# Patient Record
Sex: Male | Born: 1935 | Race: White | Hispanic: No
Health system: Southern US, Community
[De-identification: ages and names within clinical notes are randomized; demographics above are authoritative.]

---

## 2003-10-15 ENCOUNTER — Other Ambulatory Visit: Payer: Self-pay

## 2005-03-11 ENCOUNTER — Inpatient Hospital Stay: Payer: Self-pay | Admitting: Internal Medicine

## 2005-03-11 ENCOUNTER — Other Ambulatory Visit: Payer: Self-pay

## 2011-07-20 ENCOUNTER — Ambulatory Visit: Payer: Self-pay | Admitting: *Deleted

## 2012-09-06 ENCOUNTER — Ambulatory Visit: Payer: Self-pay | Admitting: *Deleted

## 2012-09-12 ENCOUNTER — Ambulatory Visit: Payer: Self-pay | Admitting: Internal Medicine

## 2012-09-18 ENCOUNTER — Inpatient Hospital Stay: Payer: Self-pay | Admitting: Internal Medicine

## 2012-09-18 LAB — COMPREHENSIVE METABOLIC PANEL
Alkaline Phosphatase: 193 U/L — ABNORMAL HIGH (ref 50–136)
Anion Gap: 14 (ref 7–16)
Creatinine: 2.63 mg/dL — ABNORMAL HIGH (ref 0.60–1.30)
EGFR (African American): 24 — ABNORMAL LOW
EGFR (Non-African Amer.): 21 — ABNORMAL LOW
Osmolality: 285 (ref 275–301)
SGOT(AST): 33 U/L (ref 15–37)
Sodium: 138 mmol/L (ref 136–145)

## 2012-09-18 LAB — CBC WITH DIFFERENTIAL/PLATELET
Basophil #: 0.2 10*3/uL — ABNORMAL HIGH (ref 0.0–0.1)
Basophil %: 1.4 %
Eosinophil #: 0.1 10*3/uL (ref 0.0–0.7)
HCT: 31.7 % — ABNORMAL LOW (ref 40.0–52.0)
HGB: 9.8 g/dL — ABNORMAL LOW (ref 13.0–18.0)
Lymphocyte #: 3.2 10*3/uL (ref 1.0–3.6)
Lymphocyte %: 28.4 %
MCH: 27.6 pg (ref 26.0–34.0)
MCV: 89 fL (ref 80–100)
Monocyte #: 0.4 x10 3/mm (ref 0.2–1.0)
Neutrophil #: 7.5 10*3/uL — ABNORMAL HIGH (ref 1.4–6.5)
Neutrophil %: 65.8 %
RBC: 3.56 10*6/uL — ABNORMAL LOW (ref 4.40–5.90)
RDW: 17.1 % — ABNORMAL HIGH (ref 11.5–14.5)
WBC: 11.4 10*3/uL — ABNORMAL HIGH (ref 3.8–10.6)

## 2012-09-19 DIAGNOSIS — J9600 Acute respiratory failure, unspecified whether with hypoxia or hypercapnia: Secondary | ICD-10-CM

## 2012-09-19 DIAGNOSIS — I5031 Acute diastolic (congestive) heart failure: Secondary | ICD-10-CM

## 2012-09-19 DIAGNOSIS — I369 Nonrheumatic tricuspid valve disorder, unspecified: Secondary | ICD-10-CM

## 2012-09-19 LAB — BASIC METABOLIC PANEL
BUN: 44 mg/dL — ABNORMAL HIGH (ref 7–18)
Calcium, Total: 9 mg/dL (ref 8.5–10.1)
Chloride: 101 mmol/L (ref 98–107)
Co2: 22 mmol/L (ref 21–32)
Creatinine: 2.41 mg/dL — ABNORMAL HIGH (ref 0.60–1.30)
EGFR (African American): 27 — ABNORMAL LOW
EGFR (Non-African Amer.): 23 — ABNORMAL LOW
Osmolality: 289 (ref 275–301)

## 2012-09-19 LAB — CBC WITH DIFFERENTIAL/PLATELET
Eosinophil #: 0 10*3/uL (ref 0.0–0.7)
HCT: 35.7 % — ABNORMAL LOW (ref 40.0–52.0)
HGB: 11.9 g/dL — ABNORMAL LOW (ref 13.0–18.0)
Lymphocyte %: 24.3 %
Monocyte #: 0 x10 3/mm — ABNORMAL LOW (ref 0.2–1.0)
Neutrophil %: 73.8 %
RDW: 16.9 % — ABNORMAL HIGH (ref 11.5–14.5)
WBC: 7.2 10*3/uL (ref 3.8–10.6)

## 2012-09-19 LAB — CK TOTAL AND CKMB (NOT AT ARMC)
CK, Total: 84 U/L (ref 35–232)
CK, Total: 95 U/L (ref 35–232)
CK, Total: 96 U/L (ref 35–232)
CK-MB: 4.1 ng/mL — ABNORMAL HIGH (ref 0.5–3.6)
CK-MB: 5.7 ng/mL — ABNORMAL HIGH (ref 0.5–3.6)
CK-MB: 6.9 ng/mL — ABNORMAL HIGH (ref 0.5–3.6)

## 2012-09-20 DIAGNOSIS — I5031 Acute diastolic (congestive) heart failure: Secondary | ICD-10-CM

## 2012-09-20 LAB — BASIC METABOLIC PANEL
Anion Gap: 11 (ref 7–16)
Calcium, Total: 8.8 mg/dL (ref 8.5–10.1)
Co2: 24 mmol/L (ref 21–32)
Creatinine: 2.81 mg/dL — ABNORMAL HIGH (ref 0.60–1.30)
EGFR (African American): 22 — ABNORMAL LOW
Glucose: 161 mg/dL — ABNORMAL HIGH (ref 65–99)
Osmolality: 295 (ref 275–301)

## 2012-09-21 LAB — FERRITIN: Ferritin (ARMC): 476 ng/mL — ABNORMAL HIGH (ref 8–388)

## 2012-09-21 LAB — BASIC METABOLIC PANEL WITH GFR
Anion Gap: 8
BUN: 57 mg/dL — ABNORMAL HIGH
Calcium, Total: 9 mg/dL
Chloride: 103 mmol/L
Co2: 29 mmol/L
Creatinine: 2.71 mg/dL — ABNORMAL HIGH
EGFR (African American): 23 — ABNORMAL LOW
EGFR (Non-African Amer.): 20 — ABNORMAL LOW
Glucose: 157 mg/dL — ABNORMAL HIGH
Osmolality: 298
Potassium: 4.1 mmol/L
Sodium: 140 mmol/L

## 2012-09-21 LAB — PROTEIN / CREATININE RATIO, URINE
Creatinine, Urine: 75 mg/dL (ref 30.0–125.0)
Protein/Creat. Ratio: 693 mg/gCREAT — ABNORMAL HIGH (ref 0–200)

## 2012-09-21 LAB — IRON AND TIBC
Iron Bind.Cap.(Total): 171 ug/dL — ABNORMAL LOW (ref 250–450)
Iron Saturation: 20 %

## 2012-09-21 LAB — PROTEIN ELECTROPHORESIS(ARMC)

## 2012-09-22 LAB — BASIC METABOLIC PANEL
Calcium, Total: 9 mg/dL (ref 8.5–10.1)
Creatinine: 2 mg/dL — ABNORMAL HIGH (ref 0.60–1.30)
Glucose: 108 mg/dL — ABNORMAL HIGH (ref 65–99)

## 2012-09-23 LAB — CBC WITH DIFFERENTIAL/PLATELET
HCT: 37.7 % — ABNORMAL LOW (ref 40.0–52.0)
HGB: 12.7 g/dL — ABNORMAL LOW (ref 13.0–18.0)
Lymphocytes: 24 %
MCH: 29.7 pg (ref 26.0–34.0)
MCHC: 33.7 g/dL (ref 32.0–36.0)
MCV: 88 fL (ref 80–100)
Monocytes: 1 %
RBC: 4.28 10*6/uL — ABNORMAL LOW (ref 4.40–5.90)
Segmented Neutrophils: 52 %
WBC: 9.7 10*3/uL (ref 3.8–10.6)

## 2012-09-23 LAB — BASIC METABOLIC PANEL
Anion Gap: 6 — ABNORMAL LOW (ref 7–16)
BUN: 53 mg/dL — ABNORMAL HIGH (ref 7–18)
Calcium, Total: 9.1 mg/dL (ref 8.5–10.1)
Creatinine: 2.19 mg/dL — ABNORMAL HIGH (ref 0.60–1.30)
Glucose: 76 mg/dL (ref 65–99)
Osmolality: 291 (ref 275–301)

## 2012-10-05 ENCOUNTER — Emergency Department: Payer: Self-pay | Admitting: Emergency Medicine

## 2012-10-05 LAB — COMPREHENSIVE METABOLIC PANEL
Albumin: 2.4 g/dL — ABNORMAL LOW (ref 3.4–5.0)
Alkaline Phosphatase: 149 U/L — ABNORMAL HIGH (ref 50–136)
Bilirubin,Total: 1.6 mg/dL — ABNORMAL HIGH (ref 0.2–1.0)
Calcium, Total: 8.7 mg/dL (ref 8.5–10.1)
Chloride: 94 mmol/L — ABNORMAL LOW (ref 98–107)
EGFR (Non-African Amer.): 8 — ABNORMAL LOW
Glucose: 82 mg/dL (ref 65–99)
SGOT(AST): 70 U/L — ABNORMAL HIGH (ref 15–37)
SGPT (ALT): 24 U/L (ref 12–78)
Sodium: 131 mmol/L — ABNORMAL LOW (ref 136–145)

## 2012-10-05 LAB — CBC WITH DIFFERENTIAL/PLATELET
Basophil #: 0.1 10*3/uL (ref 0.0–0.1)
Eosinophil #: 0 10*3/uL (ref 0.0–0.7)
HCT: 40.3 % (ref 40.0–52.0)
HGB: 13.3 g/dL (ref 13.0–18.0)
MCH: 28.6 pg (ref 26.0–34.0)
MCV: 87 fL (ref 80–100)
Monocyte #: 0.7 x10 3/mm (ref 0.2–1.0)
Neutrophil #: 16.6 10*3/uL — ABNORMAL HIGH (ref 1.4–6.5)
Platelet: 89 10*3/uL — ABNORMAL LOW (ref 150–440)
RBC: 4.65 10*6/uL (ref 4.40–5.90)
RDW: 18.2 % — ABNORMAL HIGH (ref 11.5–14.5)
WBC: 19 10*3/uL — ABNORMAL HIGH (ref 3.8–10.6)

## 2012-10-05 LAB — TROPONIN I: Troponin-I: 0.31 ng/mL — ABNORMAL HIGH

## 2012-10-05 LAB — PRO B NATRIURETIC PEPTIDE: B-Type Natriuretic Peptide: 34275 pg/mL — ABNORMAL HIGH (ref 0–450)

## 2012-10-10 LAB — CULTURE, BLOOD (SINGLE)

## 2012-10-13 ENCOUNTER — Ambulatory Visit: Payer: Self-pay | Admitting: Internal Medicine

## 2012-10-13 DEATH — deceased

## 2013-08-13 IMAGING — NM NM LUNG SCAN
2 series · 16 of 16 positions shown · non-contrast
Comparison: None

REASON FOR EXAM: pe
COMMENTS:

PROCEDURE:     NM  - NM VQ LUNG SCAN  - [DATE]  [DATE] [DATE]  [DATE]
RESULT:     Procedure: Ventilation/perfusion lung scan.
INDICATION: Shortness of breath
TECHNIQUE: Standard dynamic anterior and posterior images were obtained for
the ventilation study; anterior and posterior images in varying degrees of
obliquity were obtained for the perfusion study; the ventilation and
perfusion studies were compared with a recent chest x-ray.

[Series 1000: lung perfusion · 1.95mm/px · 4 acquisitions, 8 frames shown]
[im 1/4]
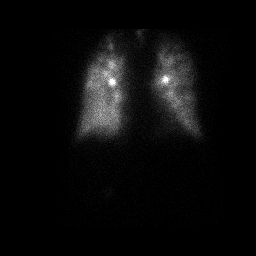
[im 1/4]
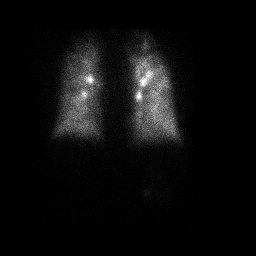
[im 2/4]
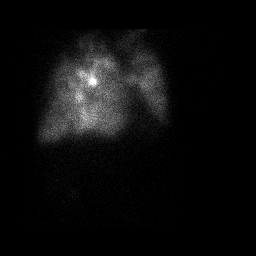
[im 2/4]
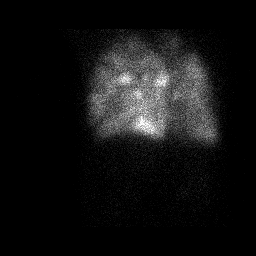
[im 3/4]
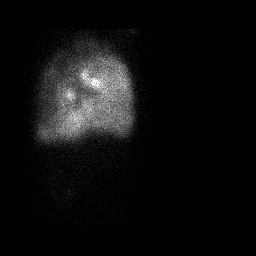
[im 3/4]
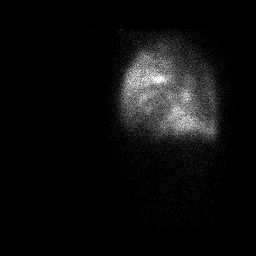
[im 4/4]
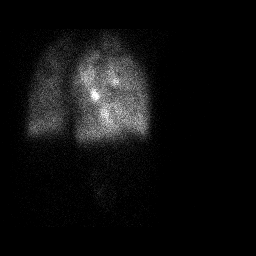
[im 4/4]
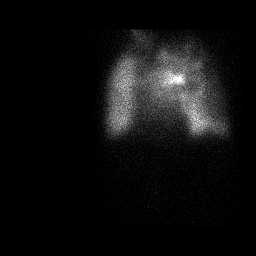

[Series 1000: lung ventilation · 3.90mm/px · 4 acquisitions, 8 frames shown]
[im 1/4]
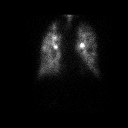
[im 1/4]
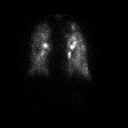
[im 2/4]
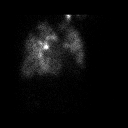
[im 2/4]
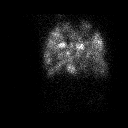
[im 3/4]
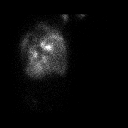
[im 3/4]
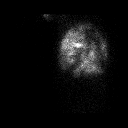
[im 4/4]
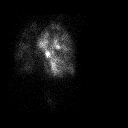
[im 4/4]
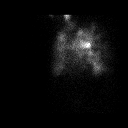

[16 of 16 positions shown; findings below may reference images not displayed]

Radiopharmaceutical: 43.48 mCi Oc-SSm DTPA inhaled gas via a nebulizer for
ventilation; 4.21 mCi Oc-SSm MAA administered intravenously for perfusion.
FINDINGS: PA and lateral Chest X-ray from 09/18/2012 : The lungs are hyperinflated
likely secondary to COPD.

Ventilation: There is heterogeneous ventilation with central deposition of
radiotracer as can be seen with COPD. There are peripheral ventilatory
defects.

Perfusion: There is slight heterogeneous ventilation without a mismatch
perfusion defect. There are bilateral peripheral perfusion defects which R
smaller than the corresponding ventilatory defect.
IMPRESSION: Indeterminate VQ scan for pulmonary embolus.

[REDACTED]

## 2015-01-02 NOTE — Consult Note (Signed)
General Aspect 81 year old male with a history of COPD on inhalers, 60 years of smoking, pacemaker (reason for placment uncertain), hypertension, who presents with Shortness of breath. Cardiology was consulted for elevated cardiac enz, possible heart failure (elevated BNP), SOB.  He has chronic dyspnea on exertion, on home oxygen at baseline. the past 7 to 10 days, he has had increasing shortness of breath and dyspnea on exertion even on oxygen. "Feels like I am not wearing my oxygen at all."  He is not even able to walk more than 10 steps without feeling short of breath. he has limited his activities secondary to extreme shortness of breath. He denies any chest pain, lower extremity edema, PND, orthopnea, fever or chills.  cough occasionally, which is dry, no sputum. He denies any wheezing.   In the ER,  BNP and troponin were elevated. He was given Lasix for possible CHF, started on steroids for COPD exacerbation, continued on inhalers. renal dysfunction on arrival, creatinine >2    Present Illness . PAST MEDICAL HISTORY:    1.  COPD, on oxygen.  2.  Hypertension.  3.  History of GI bleeding.   MEDICATIONS:  1.  Norvasc 10 mg daily.  2.  Hydralazine 10 mg in the a.m.   ALLERGIES:  ANY KIND OF NSAIDS due to his history of GI bleed.   SOCIAL HISTORY:  The patient quit smoking years ago.  former smoker 60 years. His son lives with him.  SURGICAL HISTORY:   1.  Pacemaker.  2.  CEA.  3.  Femoral artery stent placement.   FAMILY HISTORY:  no known CAD hx   Physical Exam:   GEN well developed, well nourished, no acute distress    HEENT red conjunctivae    NECK supple  No masses    RESP normal resp effort  on oxygen, mildly decreased BS throughout    CARD Regular rate and rhythm  No murmur    ABD denies tenderness  soft    SKIN normal to palpation    NEURO motor/sensory function intact    PSYCH alert, A+O to time, place, person, good insight   Review of Systems:    Subjective/Chief Complaint SOB, even on oxygen at homw with walking    General: Fatigue    Skin: No Complaints    ENT: No Complaints    Eyes: No Complaints    Neck: No Complaints    Respiratory: Short of breath    Cardiovascular: Dyspnea    Gastrointestinal: No Complaints    Genitourinary: No Complaints    Vascular: No Complaints    Musculoskeletal: No Complaints    Neurologic: No Complaints    Hematologic: No Complaints    Endocrine: No Complaints    Psychiatric: No Complaints    Review of Systems: All other systems were reviewed and found to be negative    Medications/Allergies Reviewed Medications/Allergies reviewed     Chronic lower back pain:    COPD:    Possible Crohn's disease:    Hypertension:    Hypercholesterolemia:    Femoral artery stent placement:    Carotid endarterectomy:        Admit Diagnosis:   SOB DYSPNEA: 19-Sep-2012, Active, SOB DYSPNEA      Chronic Problem:   Shortness of breath dyspnea: (786.05) 18-Sep-2012, Active, ICD9, Shortness of breath  Home Medications: Medication Instructions Status  Advair Diskus 250 mcg-50 mcg inhalation powder 1 puff(s) inhaled once a day Active  Combivent Respimat CFC free 20  mcg-100 mcg/inh inhalation aerosol 1 puff(s) inhaled 4 times a day for COPD Active  hydrALAZINE 10 mg oral tablet 1 tab(s) orally once a day (in the morning) and 2 tablets every evening Active  amlodipine 10 mg oral tablet 1 tab(s) orally once a day Active   Lab Results:  Routine Chem:  07-Jan-14 23:57    Result Comment TROPONIN - RESULTS VERIFIED BY REPEAT TESTING.  - PREVIOUS CALL:09/18/12_0 .Marland KitchenMarland KitchenTPL  Result(s) reported on 19 Sep 2012 at 01:02AM.  08-Jan-14 06:33    Result Comment TROPONIN - RESULTS VERIFIED BY REPEAT TESTING.  - PREV. C/ 09-18-12 _1  BY RW.Marland KitchenAJO  Result(s) reported on 19 Sep 2012 at 07:18AM.   Glucose, Serum  129   BUN  44   Creatinine (comp)  2.41   Sodium, Serum 138   Potassium, Serum 3.8    Chloride, Serum 101   CO2, Serum 22   Calcium (Total), Serum 9.0   Anion Gap 15   Osmolality (calc) 289   eGFR (African American)  27   eGFR (Non-African American)  23 (eGFR values <67m/min/1.73 m2 may be an indication of chronic kidney disease (CKD). Calculated eGFR is useful in patients with stable renal function. The eGFR calculation will not be reliable in acutely ill patients when serum creatinine is changing rapidly. It is not useful in  patients on dialysis. The eGFR calculation may not be applicable to patients at the low and high extremes of body sizes, pregnant women, and vegetarians.)  Cardiac:  07-Jan-14 23:57    Troponin I  0.11 (0.00-0.05 0.05 ng/mL or less: NEGATIVE  Repeat testing in 3-6 hrs  if clinically indicated. >0.05 ng/mL: POTENTIAL  MYOCARDIAL INJURY. Repeat  testing in 3-6 hrs if  clinically indicated. NOTE: An increase or decrease  of 30% or more on serial  testing suggests a  clinically important change)   CK, Total 84   CPK-MB, Serum  4.1 (Result(s) reported on 19 Sep 2012 at 12:55AM.)  08-Jan-14 06:33    Troponin I  0.09 (0.00-0.05 0.05 ng/mL or less: NEGATIVE  Repeat testing in 3-6 hrs  if clinically indicated. >0.05 ng/mL: POTENTIAL  MYOCARDIAL INJURY. Repeat  testing in 3-6 hrs if  clinically indicated. NOTE: An increase or decrease  of 30% or more on serial  testing suggests a  clinically important change)   CK, Total 95   CPK-MB, Serum  5.7 (Result(s) reported on 19 Sep 2012 at 07:15AM.)  Routine Hem:  08-Jan-14 06:33    WBC (CBC) 7.2   RBC (CBC)  4.11   Hemoglobin (CBC)  11.9   Hematocrit (CBC)  35.7   Platelet Count (CBC)  93   MCV 87   MCH 28.9   MCHC 33.3   RDW  16.9   Neutrophil % 73.8   Lymphocyte % 24.3   Monocyte % 0.6   Eosinophil % 0.1   Basophil % 1.2   Neutrophil # 5.3   Lymphocyte # 1.8   Monocyte #  0.0   Eosinophil # 0.0   Basophil # 0.1 (Result(s) reported on 19 Sep 2012 at 07:47AM.)   EKG:    Interpretation EKG shows A sense V-paced rhythm, rate 89 bpm   Radiology Results:  XRay:    07-Jan-14 15:04, Chest PA and Lateral   Chest PA and Lateral    REASON FOR EXAM:    sob  COMMENTS:       PROCEDURE: DXR - DXR CHEST PA (OR AP) AND LATERAL  - Sep 18 2012  3:04PM     RESULT: Comparison is made to the previous exam dated 06 September 2012.   There is a left-sided pacemaker device present. There is ill-defined   density laterally in the left mid chest similar to that seen on previous   exams dating back to at least 2012. This may be chronic fibrosis,   atelectasis or underlying malignancy. The lungs are hyperinflated   consistent with COPD. The interstitial markings are diffusely prominent.   Two leads are present from the left-sided pacemaker. Cardiac silhouette   is normal. Atherosclerotic calcification is present. There is no effusion   or pneumothorax.  IMPRESSION:   1. COPD. Likely chronic fibrotic changes or chronic bronchitis.   Persistent ill-defined density laterally in the left mid lung.    Dictation Site: 2        Verified By: Sundra Aland, M.D., MD  [place]:    07-Jan-14 18:42, US Kidney Bilateral   US Kidney Bilateral    REASON FOR EXAM:    arf  COMMENTS:       PROCEDURE: [place]  - US KIDNEY  - Sep 18 2012  6:42PM     RESULT: The right kidney is normal in contour. The echotexture of the   renal cortex appears to be greater than that of the adjacent liver. There   is no hydronephrosis on the right. In the upper pole there is an 86m   diameter cyst. No perinephric fluid collections are demonstrated. The   kidney measures 9.6 x 4.5 x 5.6 cm.    The left kidney is smaller than the right and measures 8.4 x 5 x 3.1 cm.   Its echotexture is mildly increased diffusely. There is no definite   evidence of hydronephrosis. A ureteral jet was demonstrated on the right   but not on the left. The urinary bladder demonstrated a volume of 204 cc     prevoid and on the  post void this falls only to 104 cc. There is a small   pleural effusion on the right.    IMPRESSION:   1. There is atrophy of the left kidney. The echotexture of the renal   cortex bilaterally is increased consistent with medical renal disease.   There is no definite evidence of obstruction.  2. There is a moderate sized post void residual urinary bladder volume.  3. A ureteral jet on the left was not demonstrated.  4. There is a small right pleural effusion.     Dictation Site: 5        Verified By: DAVID A. JMartinique M.D., MD    Lipitor: Unknown  Other- Explain in Comments Line: Unknown  Vital Signs/Nurse's Notes: **Vital Signs.:   08-Jan-14 03:57   Vital Signs Type Routine   Temperature Temperature (F) 97.5   Celsius 36.3   Temperature Source Oral   Pulse Pulse 69   Respirations Respirations 18   Systolic BP Systolic BP 1191  Diastolic BP (mmHg) Diastolic BP (mmHg) 60   Mean BP 74   Pulse Ox % Pulse Ox % 92   Pulse Ox Activity Level  At rest   Oxygen Delivery 4L     Impression 81year old male with a history of COPD on inhalers, 60 years of smoking, pacemaker (reason for placment uncertain), hypertension, who presents with Shortness of breath. Cardiology was consulted for elevated cardiac enz, possible heart failure (elevated BNP), SOB.  1) SOB/acute respiratory distress: No sputum/cough, less likely bronchitis  With elevated BNP, more concerning for acute diastolic CHF, echo pending Suspect he may have component of pulmonary HTN/right heart stretch contributing to elevated BNP --Unable to exclude some component of COPD exacerbation, on steriods, started yesterday --Will watch for echo, may need to proceed with lasix cautiously given atropgy of one kidney RVSP on echo can help guide diuresis  2) Renal failure atrophy of left kidney also with urine retention --Currently on lasix BID IV Will watch for echo and RVSP measurements before PM lasix dose.  3) HTN: low  BP this AM, possibly from lasix Amlodipine and hydralazine held this AM.  4) pacemaker: Reason for pacer placement uncertain, will discuss with son Appears to be A sense, V-pacing, rate 89   Electronic Signatures: Ida Rogue (MD)  (Signed 08-Jan-14 09:41)  Authored: General Aspect/Present Illness, History and Physical Exam, Review of System, Past Medical History, Health Issues, Home Medications, Labs, EKG , Radiology, Allergies, Vital Signs/Nurse's Notes, Impression/Plan   Last Updated: 08-Jan-14 09:41 by Ida Rogue (MD)

## 2015-01-02 NOTE — H&P (Signed)
DATE OF BIRTH:  1921/06/21  PRIMARY CARE PHYSICIAN:  Dr. Tobias Alexander in [place].   PRIMARY CARDIOLOGIST:  Dr. Sharon Seller.   CHIEF COMPLAINT:  Worsening shortness of breath and weakness.  HISTORY OF PRESENT ILLNESS:  The patient is a very pleasant 81 year old male with a history of COPD, pacemaker, hypertension, who presents with the above complaint. The patient says he has chronic dyspnea on exertion, shortness of breath. However, over the past 7 to 10 days, he has had increasing shortness of breath and dyspnea on exertion. He is not even able to walk more than 10 steps without feeling short of breath and has not been out of the house because of his extreme shortness of breath and dyspnea on exertion. He denies any chest pain, lower extremity edema, PND, orthopnea, fever or chills. He has got a cough occasionally, which is dry. He denies any wheezing. In the ER, he had a chest x-ray, which is not impressive. However, he is an elevated BNP and elevated troponin. He was given Lasix for possible COPD exacerbation.   REVIEW OF SYSTEMS:  CONSTITUTIONAL:  He denies any fever. He has fatigue and weakness and weight loss . No blurred or double vision. HEENT:  He has some hearing loss and is hard of hearing. No epistaxis, discharge or sinus pain.  RESPIRATORY:  He has a dry cough. No wheezing, hemoptysis. Positive dyspnea. Positive history of COPD, on oxygen since Christmas Eve.  GASTROINTESTINAL:  No nausea, vomiting, diarrhea, abdominal pain, melena or ulcers. He denies any melena. He has a remote history of ulcers. No GERD.  GENITOURINARY:  No dysuria or hematuria.  ENDOCRINE:  No polydipsia or thyroid problems.   No easy bruising or bleeding.  SKIN: No rash or lesion.  MUSCULOSKELETAL:  No limited activity except for the past week and a half due to dyspnea on exertion and shortness of breath.  NEUROLOGICAL:  No history of CVA or TIA.  PSYCHIATRIC:  No history of depression.   PAST MEDICAL HISTORY:     1.  COPD, on oxygen.  2.  Hypertension.  3.  History of GI bleeding.   MEDICATIONS:  1.  Norvasc 10 mg daily.  2.  Hydralazine 10 mg in the a.m.   ALLERGIES:  ANY KIND OF NSAIDS due to his history of GI bleed.   SOCIAL HISTORY:  The patient quit smoking. However, he was a former smoker for over 30 years. His son lives with him.  SURGICAL HISTORY:   1.  Pacemaker.  2.  CEA.  3.  Femoral artery stent placement.   FAMILY HISTORY:  Unknown.   PHYSICAL EXAMINATION:  VITAL SIGNS:  Temperature is 98, pulse is 98, respirations 26, blood pressure 156/65 and 92% on room air.  GENERAL:  The patient is alert, oriented x 3, not in acute distress.  HEENT:  Head is atraumatic. Pupils are round and reactive. Sclerae anicteric. Mucous membranes are moist. Oropharynx is clear.  NECK:  Supple without JVD, carotid bruit CARDIOVASCULAR:  Regular rate and rhythm. No murmurs, gallops or rubs. PMI is not displaced.  LUNGS:  He has got some scattered crackles. No wheezing, rhonchi or rales are heard.  BACK:  No CVA or vertebral tenderness. ABDOMEN:  Bowel sounds positive. Nontender, nondistended. No hepatosplenomegaly.  EXTREMITIES:  No clubbing, cyanosis, edema.  NEUROLOGICAL:  Cranial nerves II through XII are intact. No focal deficits.  SKIN:  Without rashes or lesions.   LABORATORIES:  White blood cells 11.4, hemoglobin 9.8, hematocrit 32, platelets  are 113. Sodium 138, potassium 4.1, chloride 104, bicarb 20, BUN 38, creatinine 2.63, glucose 100. Calcium 8.9, bilirubin 0.9, alk phos 193, ALT 28, AST 33, total protein 7.2, albumin 2.7. Troponin 0.15, BNP 24,433. Chest x-ray shows no acute cardiopulmonary disease. He does have some chronic fibrotic changes. EKG shows a paced rhythm.   ASSESSMENT AND PLAN:  A 81 year old male, who comes in with acute-on-chronic respiratory failure, elevated troponin and acute renal failure.  1.  Acute-on-chronic respiratory failure. This could be possibly due to mild  congestive heart failure and chronic obstructive pulmonary disease triggered by the congestive heart failure. The patient could also have suffered a myocardial infarction with his elevated troponin and/or could have a pulmonary emboli. We will continue the patient on oxygen. We will use low-dose Lasix, as well as low-dose steroids, order a V/Q scan to rule out pulmonary emboli, and follow troponin levels. We will also go ahead and consult cardiology.  2.  Acute renal failure. We will hold any nephrotoxic agents. We will check a renal ultrasound, and if the creatinine does not improve in the a.m., we will consider a renal consult. Due to possible congestive heart failure, I have not started any IV fluids.  3.  Elevated troponin. This could be secondary to his dyspnea or poor renal clearance and/or acute exacerbation of congestive heart failure and chronic obstructive pulmonary disease. We will order an echocardiogram, follow troponins, place the patient on telemetry.  4.  Thrombocytopenia. The patient's platelet count is 113. We will need to monitor carefully. I have been using heparin for his deep vein thrombosis prophylaxis, and we will need to follow CBC.  5.  Hypertension. We will continue his outpatient medications.  6.  The patient is a DO NOT RESUSCITATE status.   TIME SPENT:  Approximately 45 minutes.  ____________________________ Donell Beers. Benjie Karvonen, MD spm:ms D: 09/18/2012 17:45:10 ET T: 09/18/2012 18:28:39 ET JOB#: [id number]  cc: Jaiah Weigel P. Benjie Karvonen, MD, <Dictator> Dr. Tobias Alexander in [place] Jedrick Hutcherson MD ELECTRONICALLY SIGNED 09/18/2012 20:10

## 2015-01-02 NOTE — Consult Note (Signed)
PATIENT NAME:  Ian Mcdonald, Ian Mcdonald MR#:  [id number] DATE OF BIRTH:  1920-11-18  DATE OF CONSULTATION:  09/20/2012  REFERRING PHYSICIAN:  Hillary Bow, MD CONSULTING PHYSICIAN:  Shunte Senseney Lilian Kapur, MD  REASON FOR CONSULTATION: Acute renal failure, chronic kidney disease stage IV.   HISTORY OF PRESENT ILLNESS: The patient is a very pleasant 81 year old Caucasian male with past medical history of hypertension, COPD on home oxygen, history of gastric ulcer bleeding, history of carotid endarterectomy, history of NSAID use, chronic kidney disease stage IV and status post pacemaker placement who presented to [facility] with progressive shortness of breath. It appears that he may have underlying diastolic heart failure from echo result and evaluation by cardiology. We are asked to see the patient for evaluation and management of acute renal failure in the setting of chronic kidney disease, stage IV. We do not have a recent creatinine in our system. Last creatinine in our system is from July 2006 at which point in time creatinine was 1.8. Upon presentation the patient's creatinine was 2.63.  Creatinine today is higher at 2.8. He has been receiving Lasix 40 mg IV every 12 hours. The patient has had good urine output and is negative 1900 mL for the admission. He denies ever having seen a nephrologist in the past. The patient is followed by Dr. Tobias Alexander in [place], [place]. He states that he has not been told in the past that he has underlying chronic kidney disease. It also appears that he has mild anemia of chronic kidney disease. Most recent hemoglobin was 11.9 with a MCV of 87. He currently denies nausea, vomiting and dysgeusia. EGFR at present is 19. The patient had a renal ultrasound performed which showed a relatively small kidney at 8.4 cm. The right kidney was 9.6 cm. He also had a V/Q scan performed this admission which was indeterminate.   PAST MEDICAL  HISTORY: 1. COPD. 2. Hypertension.  3. History of carotid endarterectomy.  4. History of gastric ulcers due to NSAIDs.  5. Chronic kidney disease, stage IV.  6. History of pacemaker placement.  7. Diastolic heart failure.   ALLERGIES: LIPITOR.   CURRENT INPATIENT MEDICATIONS:  1. Tylenol 650 mg p.o. every 4 hours p.r.n.  2. Albuterol ipratropium 1 puff inhaled 4 times daily. 3. Advair 250/50 one puff inhaled 2 times a day. 4. Furosemide 40 mg IV every 12 hours. 5. Heparin 5000 units subcutaneous every 8 hours. 6. Solu-Medrol 40 mg IV every 12 hours.  7. Zofran 4 mg IV every 4 hours p.r.n.  8. Protonix 40 mg p.o. every 6:00 a.m.  9. Potassium chloride 20 milliequivalents p.o. x 1.   SOCIAL HISTORY: The patient lives in [place]. He lives with his son. He is a widower. He has a prior history of tobacco abuse, but quit many years ago. He did smoke approximately for 60 years. He denies alcohol or illicit drug use.   FAMILY HISTORY: The patient is unaware of how his mother and father died.   REVIEW OF SYSTEMS:  CONSTITUTIONAL: Denies fevers, chills and weight loss.   EYES: Denies diplopia or blurry vision.   HEENT: Denies headaches or hearing loss. Denies epistaxis.   CARDIOVASCULAR: Does have dyspnea with exertion, but denies chest pain or palpitations.   RESPIRATORY: Denies cough or hemoptysis. Does have shortness of breath.   GASTROINTESTINAL: Denies nausea, vomiting and dysgeusia.   GENITOURINARY: Denies frequency, urgency or dysuria.   MUSCULOSKELETAL: Denies joint pain, swelling or redness.   INTEGUMENTARY:  Denies skin rashes or lesions.   NEUROLOGIC: Denies focal extremity numbness, weakness or tingling.   PSYCHIATRIC: Denies depression or bipolar disorder.   ENDOCRINE: Denies polyuria, polydipsia or polyphagia.   HEMATOLOGIC/LYMPHATIC: Denies easy bruisability, bleeding or swollen lymph nodes.   ALLERGY/IMMUNOLOGIC: Denies seasonal allergies or history of  immunodeficiency.   PHYSICAL EXAMINATION:  VITAL SIGNS: Temperature is 97.8, pulse 84, respirations 20, blood pressure 142/60 and pulse oximetry 90% on 4 liters.   GENERAL: Examination reveals a slender, Caucasian male who appears younger than his stated age, currently in no acute distress.   HEENT: Normocephalic, atraumatic. Extraocular movements are intact. Pupils are equal, round and reactive to light. No scleral icterus. Conjunctivae are pink. No epistaxis noted. Gross hearing intact. Oral mucosa is dry.  NECK: Supple without JVD or lymphadenopathy.   RESPIRATORY: Lungs are currently clear to auscultation bilaterally with normal respiratory effort.   CARDIOVASCULAR: S1 AND S2 regular rate and rhythm, II/IV systolic ejection murmur heard.   ABDOMEN: Soft, nontender and nondistended. Bowel sounds positive. No rebound or guarding. No gross organomegaly appreciated.   EXTREMITIES: No clubbing, cyanosis or edema.   NEUROLOGIC: The patient is alert and oriented to time, person and place. Strength is 5 out of 5 in both upper and lower extremities.   SKIN: Warm and dry. No rashes noted.   MUSCULOSKELETAL: No joint redness, swelling or tenderness appreciated.   GENITOURINARY: No suprapubic tenderness is noted at this time.   PSYCHIATRIC: The patient is with an appropriate affect and appears to have good insight into his current illness.   LABS/RADIOLOGIC STUDIES: Sodium 139, potassium 3.4, chloride 104, CO2 24, BUN 53, creatinine 2.8 and glucose 161. BNP today is down to 12,887.  V/Q scan is indeterminate.   2-D echocardiogram shows ejection fraction of 50 to 55%, right ventricular systolic pressure is elevated at 40 to 55 mmHg.  CBC shows WBC of 7.2, hemoglobin 11.9, hematocrit 35, platelets 93 and MCV of 87.   Renal ultrasound shows an atrophic left kidney measuring 8.4 cm and the right kidney measuring 9.6 cm.  ASSESSMENT AND PLAN: This is a 81 year old Caucasian male with past  medical history of hypertension, COPD, carotid endarterectomy, history of NSAID use, gastric ulcers, chronic kidney disease stage IV and history of pacemaker placement who presented to [facility] with shortness of breath and found to have diastolic heart failure.  1. Acute renal failure/chronic kidney disease, stage IV. Upon presentation the patient's creatinine was 2.63. At that time eGFR was 21. Creatinine is now up to 2.8 with an eGFR of 19. He had a renal ultrasound which was negative for hydronephrosis. However, he did have mildly atrophic left kidney which is likely the result of chronic kidney disease and possible underlying vascular disease. No indication for dialysis at this time. Worsening in renal function now is likely attributed to diuretics. We would recommend holding diuretic therapy as the patient appears to be euvolemic at this time. Certainly if he were to become short of breath we could reinstitute Lasix therapy. Once creatinine drifts down would recommend starting the patient on lower dose Lasix such as 40 mg daily. We will check SPEP, UPEP and ANA for further work-up. We will also screen the patient for secondary hyperparathyroidism.  2. Hypertension. Blood pressure currently is 142/60. He is currently maintained on hydralazine; however, this was placed on hold. We will continue to monitor blood pressure.  3. Anemia of chronic kidney disease. Hemoglobin is 11.9 with an MCV of  87. We will check serum iron studies and also check SPEP and UPEP for further work-up.  4. Shortness of breath. Most likely due to diastolic heart failure. BNP was quite high upon admission at 24,433. It is now down to 12,887. We have held the Lasix for now as renal function is worsening and BUN is climbing. We will hold off on hydration. We will await further input from Dr. Rockey Situ as well.  5. Hyperkalemia. Serum potassium is 3.4. Potassium chloride 20 milliequivalents has been ordered. Continue  to follow serum potassium.   I would like to thank Dr. Darvin Neighbours for this kind referral. Further plan as the patient progresses.  ____________________________ Tama High, MD mnl:sb D: 09/20/2012 13:47:32 ET T: 09/20/2012 14:12:45 ET JOB#: [id number]  cc: Tama High, MD, <Dictator> Tama High MD ELECTRONICALLY SIGNED 10/19/2012 22:47

## 2015-01-02 NOTE — Discharge Summary (Signed)
PATIENT NAME:  Ian Mcdonald, WRINKLE MR#:  [id number] DATE OF BIRTH:  [birth year]  DATE OF ADMISSION:  09/18/2012 DATE OF DISCHARGE:  09/23/2012  PRIMARY CARE PHYSICIAN:  Dr. Lonia Blood.  HISTORY OF PRESENT ILLNESS:  A 81 year old male with complaint of COPD, pacemaker, hypertension, presented with worsening shortness of breath and weakness and gradually worsened to the level that he was not able to walk more than 10 steps without feeling short of breath, not been out of the house because of extreme shortness of breath and dyspnea on exertion.  He was also noted having elevated BNP and elevated troponin on admission.   HOSPITAL COURSE AND STAY:  COPD exacerbation, acute respiratory failure.  He was on steroids and nebulizers.  As he did not have significant improvement for the next 3 to 4 days so pulmonary consult was called in and CT chest was done.  CT chest showed emphysematous changes and pneumonia and so we added Levaquin to his medication therapy which was not started initially.  Then, in the next two days he had some improvement in his condition that he was able to tolerate 2 liters nasal cannula oxygen and he was discharged with that.    OTHER MEDICAL ISSUES ADDRESSED DURING THIS HOSPITAL STAY:  Acute diastolic CHF.  He had elevated BNP and elevated troponin on admission.  Echocardiogram was done and IV Lasix started initially.  Echocardiogram showed relaxation failure, so diastolic CHF, acute.  Cardiology consult was done and he was started on low-dose beta-blocker, Lasix, later on switched to oral and he remained symptomatically improved so discharged home.    Other medical problems, acute renal failure over chronic kidney disease.  Baseline creatinine was unknown.  Nephrology consult was done and it was slow, but gradual improvement in his creatine.    Hypertension, he was on low-dose beta-blockers.    LABORATORY RESULTS:  Chest x-ray on admission, COPD, likely chronic, fibrotic changes,  chronic bronchitis, persistent ill-defined density laterally in the left mid lung.  WBC 11.4, hemoglobin 9.8, platelet count 113, and MCV 89 on admission.  BUN 38, creatinine 2.63, sodium 138, potassium 4.1, chloride 104, and CO2 20.  Troponin 0.15.  BNP 24,433 on admission.  US kidney bilateral was done and it showed atrophy of the left kidney.  No definite evidence of obstruction, moderate-sized postvoid residual urinary bladder volume present, ureteral jet on the left was not demonstrated, small right pleural effusion.  Troponin 0.1 repeat.  Repeat creatine after one day 2.41, and third troponin came 0.09.  Echocardiogram, impression, ejection fraction 50% to 55%, left ventricular systolic function normal, transmitral septal Doppler flow pattern suggestive of impaired LV relaxation.  Septal motion is consistent with conduction abnormality.  Right ventricular systolic function is normal, mild tricuspid regurgitation.  Right ventricular systolic pressure elevated 40 to 50 mmHg.  V/Q scan, indeterminate V/Q scan for pulmonary embolus.  Creatinine went up to 2.81, magnesium 2.4.  [place] bilateral lower limb Doppler negative.   CONDITION ON DISCHARGE:  Stable.   CODE STATUS ON DISCHARGE:  NO CODE.  DO NOT RESUSCITATE.   MEDICATIONS ON DISCHARGE:  Advair Diskus 1 puff once a day, Combivent 1 puff 4 times a day, amlodipine 10 mg 0.5 tablet once a day, prednisone 10 mg tapering, metoprolol 25 mg once a day, tiotropium 18 mcg once a day, furosemide 20 mg once a day, pantoprazole 40 mg once a day, levofloxacin 250 mg oral tablet every other day, budesonide 0.5 mg inhalation suspension 1 puff 2  times a day.   HOME OXYGEN:  Yes, 2 liters nasal cannula.    DIET:  Low sodium, regular consistency diet.   ACTIVITY:  As tolerated.   FOLLOW-UP:  With PMD within 1 to 2 weeks.  Needs to follow up with nephrologist for creatinine checkup.  On discharge it was 2.19.  X-ray suggestive of left upper lobe and pleural base  infiltrate or mass, improved with treatment symptomatically, now 2 liters nasal cannula oxygen.  Need to follow up chest x-ray to see with malignancy in 3 to 4 weeks.    TOTAL TIME SPENT IN DISCHARGE:  Forty-five minutes.     ____________________________ Hope PigeonVaibhavkumar G. Elisabeth PigeonVachhani, MD vgv:ea D: 09/27/2012 23:26:48 ET T: 09/28/2012 06:05:37 ET JOB#: [id number]  cc: Hope PigeonVaibhavkumar G. Elisabeth PigeonVachhani, MD, <Dictator> Dr. Lonia BloodPhilip Singer Outside Physician  Altamese DillingVAIBHAVKUMAR Shermaine Brigham MD ELECTRONICALLY SIGNED 10/23/2012 17:26
